# Patient Record
Sex: Female | Born: 1975 | Race: White | Hispanic: No | Marital: Married | Smoking: Former smoker
Health system: Southern US, Community
[De-identification: ages and names within clinical notes are randomized; demographics above are authoritative.]

## PROBLEM LIST (undated history)

## (undated) DIAGNOSIS — T4145XA Adverse effect of unspecified anesthetic, initial encounter: Secondary | ICD-10-CM

## (undated) DIAGNOSIS — T8859XA Other complications of anesthesia, initial encounter: Secondary | ICD-10-CM

## (undated) DIAGNOSIS — Z973 Presence of spectacles and contact lenses: Secondary | ICD-10-CM

## (undated) DIAGNOSIS — D649 Anemia, unspecified: Secondary | ICD-10-CM

---
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
## 1999-11-18 ENCOUNTER — Ambulatory Visit (HOSPITAL_BASED_OUTPATIENT_CLINIC_OR_DEPARTMENT_OTHER): Admission: RE | Admit: 1999-11-18 | Discharge: 1999-11-18 | Payer: Self-pay | Admitting: *Deleted

## 2011-11-07 LAB — OB RESULTS CONSOLE HEPATITIS B SURFACE ANTIGEN: Hepatitis B Surface Ag: NEGATIVE

## 2011-11-07 LAB — OB RESULTS CONSOLE RUBELLA ANTIBODY, IGM: Rubella: IMMUNE

## 2011-11-07 LAB — OB RESULTS CONSOLE HIV ANTIBODY (ROUTINE TESTING): HIV: NONREACTIVE

## 2011-11-07 LAB — OB RESULTS CONSOLE RPR: RPR: NONREACTIVE

## 2011-11-21 LAB — OB RESULTS CONSOLE GC/CHLAMYDIA: Gonorrhea: NEGATIVE

## 2012-01-25 NOTE — L&D Delivery Note (Signed)
Delivery Note  SVD viable female Apgars 8,9 over 2nd degree ML epis.  Placenta delivered spontaneously intact with 3VC. Repair with 2-0 Chromic with good support and hemostasis noted and R/V exam confirms.  PH art was sent.  [place] cord blood was not done.  Mother and baby were doing well.  EBL 350cc  Candice Camp, MD

## 2012-05-16 LAB — OB RESULTS CONSOLE GBS: GBS: NEGATIVE

## 2012-06-02 ENCOUNTER — Inpatient Hospital Stay (HOSPITAL_COMMUNITY)
Admission: AD | Admit: 2012-06-02 | Discharge: 2012-06-02 | Disposition: A | Discharge status: Home / Self Care | Payer: BC Managed Care – PPO | Source: Ambulatory Visit | Attending: Obstetrics & Gynecology | Admitting: Obstetrics & Gynecology

## 2012-06-02 ENCOUNTER — Encounter (HOSPITAL_COMMUNITY): Payer: Self-pay | Admitting: *Deleted

## 2012-06-02 DIAGNOSIS — N949 Unspecified condition associated with female genital organs and menstrual cycle: Secondary | ICD-10-CM | POA: Insufficient documentation

## 2012-06-02 DIAGNOSIS — N898 Other specified noninflammatory disorders of vagina: Secondary | ICD-10-CM

## 2012-06-02 DIAGNOSIS — O4700 False labor before 37 completed weeks of gestation, unspecified trimester: Secondary | ICD-10-CM | POA: Insufficient documentation

## 2012-06-02 DIAGNOSIS — O26893 Other specified pregnancy related conditions, third trimester: Secondary | ICD-10-CM

## 2012-06-02 DIAGNOSIS — O479 False labor, unspecified: Secondary | ICD-10-CM

## 2012-06-02 DIAGNOSIS — O99891 Other specified diseases and conditions complicating pregnancy: Secondary | ICD-10-CM | POA: Insufficient documentation

## 2012-06-02 LAB — POCT FERN TEST: POCT Fern Test: NEGATIVE

## 2012-06-02 NOTE — MAU Provider Note (Signed)
  History     CSN: [id number]  Arrival date and time: 06/02/12 1349   None     No chief complaint on file.  HPI This is a 37 y.o. female who presents at 59 weeks with c/o several episodes of underwear being wet. No wetting of linens. Does not feel contractions.   OB History   Grav Para Term Preterm Abortions TAB SAB Ect Mult Living   2 0        0      Past Medical History  Diagnosis Date  . Herpes     Past Surgical History  Procedure Laterality Date  . Appendectomy    . Cholecystectomy      No family history on file.  History  Substance Use Topics  . Smoking status: Former Smoker    Quit date: 10/04/2011  . Smokeless tobacco: Not on file  . Alcohol Use: No    Allergies: No Known Allergies  Prescriptions prior to admission  Medication Sig Dispense Refill  . Docosahexaenoic Acid (DHA PO) Take 1 tablet by mouth daily.      . Prenatal Vit-Fe Fumarate-FA (PRENATAL MULTIVITAMIN) TABS Take 1 tablet by mouth daily at 12 noon.      . valACYclovir (VALTREX) 500 MG tablet Take 500 mg by mouth daily.        Review of Systems  Constitutional: Negative for fever and chills.  Gastrointestinal: Negative for nausea, vomiting, abdominal pain, diarrhea and constipation.  Genitourinary: Negative for dysuria.   Physical Exam   Blood pressure 110/71, pulse 85, temperature 98.7 F (37.1 C), temperature source Oral, resp. rate 18.  Physical Exam  Constitutional: She is oriented to person, place, and time. She appears well-developed and well-nourished. No distress.  HENT:  Head: Normocephalic.  Cardiovascular: Normal rate.   Respiratory: Effort normal.  GI: Soft. She exhibits no distension and no mass. There is no tenderness. There is no rebound and no guarding.  Genitourinary: Vagina normal and uterus normal.  SSE:  No pooling            No ferning  Musculoskeletal: Normal range of motion.  Neurological: She is alert and oriented to person, place, and time.  Skin: Skin  is warm and dry.  Psychiatric: She has a normal mood and affect.   FHR reactive Mild contrations every 2-4 minutes, pt does not feel these  Dilation: 1 Effacement (%): 80 Cervical Position: Middle Station: -2 Presentation: Vertex Exam by:: M.Dimitrios Balestrieri,CNM  MAU Course  Procedures  Assessment and Plan  A:  SIUP at 38 wks      Vaginal discharge      No rupture of membranes  P:  Discharge home       Followup in office [facility] 06/02/2012, 3:32 PM

## 2012-06-04 ENCOUNTER — Inpatient Hospital Stay (HOSPITAL_COMMUNITY)
Admission: AD | Admit: 2012-06-04 | Discharge: 2012-06-04 | Disposition: A | Discharge status: Home / Self Care | Payer: BC Managed Care – PPO | Source: Ambulatory Visit | Attending: Obstetrics & Gynecology | Admitting: Obstetrics & Gynecology

## 2012-06-04 ENCOUNTER — Encounter (HOSPITAL_COMMUNITY): Payer: Self-pay | Admitting: Anesthesiology

## 2012-06-04 ENCOUNTER — Encounter (HOSPITAL_COMMUNITY): Payer: Self-pay | Admitting: *Deleted

## 2012-06-04 ENCOUNTER — Inpatient Hospital Stay (HOSPITAL_COMMUNITY): Payer: BC Managed Care – PPO | Admitting: Anesthesiology

## 2012-06-04 ENCOUNTER — Encounter (HOSPITAL_COMMUNITY): Payer: Self-pay

## 2012-06-04 ENCOUNTER — Inpatient Hospital Stay (HOSPITAL_COMMUNITY)
Admission: AD | Admit: 2012-06-04 | Discharge: 2012-06-06 | DRG: 373 | Disposition: A | Discharge status: Home / Self Care | Payer: BC Managed Care – PPO | Source: Ambulatory Visit | Attending: Obstetrics and Gynecology | Admitting: Obstetrics and Gynecology

## 2012-06-04 DIAGNOSIS — O878 Other venous complications in the puerperium: Secondary | ICD-10-CM | POA: Diagnosis present

## 2012-06-04 DIAGNOSIS — K649 Unspecified hemorrhoids: Secondary | ICD-10-CM | POA: Diagnosis present

## 2012-06-04 DIAGNOSIS — O4700 False labor before 37 completed weeks of gestation, unspecified trimester: Secondary | ICD-10-CM | POA: Insufficient documentation

## 2012-06-04 LAB — CBC
HCT: 34.9 % — ABNORMAL LOW (ref 36.0–46.0)
Hemoglobin: 11.8 g/dL — ABNORMAL LOW (ref 12.0–15.0)
MCH: 31.1 pg (ref 26.0–34.0)
MCHC: 33.8 g/dL (ref 30.0–36.0)
MCV: 92.1 fL (ref 78.0–100.0)
Platelets: 155 10*3/uL (ref 150–400)
RBC: 3.79 MIL/uL — ABNORMAL LOW (ref 3.87–5.11)
RDW: 14.4 % (ref 11.5–15.5)
WBC: 11.3 10*3/uL — ABNORMAL HIGH (ref 4.0–10.5)

## 2012-06-04 MED ORDER — PRENATAL MULTIVITAMIN CH: 1.0000 | ORAL_TABLET | Freq: Every day | ORAL | Status: DC

## 2012-06-04 MED ORDER — PHENYLEPHRINE 40 MCG/ML (10ML) SYRINGE FOR IV PUSH (FOR BLOOD PRESSURE SUPPORT)
80.0000 ug | PREFILLED_SYRINGE | INTRAVENOUS | Status: DC | PRN
  Filled 2012-06-04: qty 5
  Filled 2012-06-04: qty 2

## 2012-06-04 MED ORDER — DIPHENHYDRAMINE HCL 50 MG/ML IJ SOLN: 12.5000 mg | INTRAMUSCULAR | Status: DC | PRN

## 2012-06-04 MED ORDER — LIDOCAINE HCL (PF) 1 % IJ SOLN
30.0000 mL | INTRAMUSCULAR | Status: DC | PRN
  Filled 2012-06-04 (×2): qty 30

## 2012-06-04 MED ORDER — OXYTOCIN 40 UNITS IN LACTATED RINGERS INFUSION - SIMPLE MED
INTRAVENOUS | Status: AC
  Filled 2012-06-04: qty 1000

## 2012-06-04 MED ORDER — EPHEDRINE 5 MG/ML INJ
10.0000 mg | INTRAVENOUS | Status: DC | PRN
  Filled 2012-06-04: qty 2

## 2012-06-04 MED ORDER — PHENYLEPHRINE 40 MCG/ML (10ML) SYRINGE FOR IV PUSH (FOR BLOOD PRESSURE SUPPORT)
80.0000 ug | PREFILLED_SYRINGE | INTRAVENOUS | Status: DC | PRN
  Filled 2012-06-04: qty 2

## 2012-06-04 MED ORDER — LACTATED RINGERS IV SOLN
INTRAVENOUS | Status: DC
  Administered 2012-06-04 (×2): via INTRAVENOUS

## 2012-06-04 MED ORDER — HYDROCODONE-ACETAMINOPHEN 5-325 MG PO TABS
2.0000 | ORAL_TABLET | Freq: Once | ORAL | Status: AC | PRN
  Administered 2012-06-04: 2 via ORAL
  Filled 2012-06-04: qty 2

## 2012-06-04 MED ORDER — LIDOCAINE HCL (PF) 1 % IJ SOLN
INTRAMUSCULAR | Status: DC | PRN
  Administered 2012-06-04 (×2): 8 mL

## 2012-06-04 MED ORDER — OXYCODONE-ACETAMINOPHEN 5-325 MG PO TABS: 1.0000 | ORAL_TABLET | ORAL | Status: DC | PRN

## 2012-06-04 MED ORDER — ACETAMINOPHEN 325 MG PO TABS: 650.0000 mg | ORAL_TABLET | ORAL | Status: DC | PRN

## 2012-06-04 MED ORDER — IBUPROFEN 600 MG PO TABS
600.0000 mg | ORAL_TABLET | Freq: Four times a day (QID) | ORAL | Status: DC | PRN
  Administered 2012-06-04: 600 mg via ORAL
  Filled 2012-06-04: qty 1

## 2012-06-04 MED ORDER — FENTANYL 2.5 MCG/ML BUPIVACAINE 1/10 % EPIDURAL INFUSION (WH - ANES)
INTRAMUSCULAR | Status: DC | PRN
  Administered 2012-06-04: 14 mL/h via EPIDURAL

## 2012-06-04 MED ORDER — LACTATED RINGERS IV SOLN: 500.0000 mL | Freq: Once | INTRAVENOUS | Status: DC

## 2012-06-04 MED ORDER — OXYTOCIN BOLUS FROM INFUSION
500.0000 mL | INTRAVENOUS | Status: DC
  Administered 2012-06-04: 500 mL via INTRAVENOUS

## 2012-06-04 MED ORDER — OXYTOCIN 40 UNITS IN LACTATED RINGERS INFUSION - SIMPLE MED: 62.5000 mL/h | INTRAVENOUS | Status: DC

## 2012-06-04 MED ORDER — LACTATED RINGERS IV SOLN: 500.0000 mL | INTRAVENOUS | Status: DC | PRN

## 2012-06-04 MED ORDER — FLEET ENEMA 7-19 GM/118ML RE ENEM: 1.0000 | ENEMA | RECTAL | Status: DC | PRN

## 2012-06-04 MED ORDER — FENTANYL 2.5 MCG/ML BUPIVACAINE 1/10 % EPIDURAL INFUSION (WH - ANES)
14.0000 mL/h | INTRAMUSCULAR | Status: DC | PRN
  Filled 2012-06-04: qty 125

## 2012-06-04 MED ORDER — CITRIC ACID-SODIUM CITRATE 334-500 MG/5ML PO SOLN: 30.0000 mL | ORAL | Status: DC | PRN

## 2012-06-04 MED ORDER — EPHEDRINE 5 MG/ML INJ
10.0000 mg | INTRAVENOUS | Status: DC | PRN
  Filled 2012-06-04: qty 4
  Filled 2012-06-04: qty 2

## 2012-06-04 MED ORDER — ONDANSETRON HCL 4 MG/2ML IJ SOLN: 4.0000 mg | Freq: Four times a day (QID) | INTRAMUSCULAR | Status: DC | PRN

## 2012-06-04 NOTE — H&P (Signed)
Jocelyn Leonard is a 37 y.o. female presenting for labor eval.  Pregnancy uncomplicated.  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  Ctxs q 2-3 GBS-. History OB History   Grav Para Term Preterm Abortions TAB SAB Ect Mult Living   1 0        0     Past Medical History  Diagnosis Date  . Herpes    Past Surgical History  Procedure Laterality Date  . Appendectomy    . Cholecystectomy     Family History: family history is not on file. Social History:  reports that she quit smoking about 8 months ago. She has never used smokeless tobacco. She reports that she does not drink alcohol or use illicit drugs.   Prenatal Transfer Tool  Maternal Diabetes: No Genetic Screening: Normal Maternal Ultrasounds/Referrals: Normal Fetal Ultrasounds or other Referrals:  None Maternal Substance Abuse:  No Significant Maternal Medications:  None Significant Maternal Lab Results:  None Other Comments:  None  ROS  Dilation: 8.5 Effacement (%): 100 Station: -2 Exam by:: Dr. Rana Snare Blood pressure 118/76, pulse 117, temperature 98 F (36.7 Leonard), temperature source Oral, resp. rate 16, height 5\' 3"  (1.6 m), weight 88.451 kg (195 lb), SpO2 99.00%. Exam Physical Exam  Prenatal labs: ABO, Rh: AB/Positive/-- (10/14 0000) Antibody: Negative (10/14 0000) Rubella: Immune (10/14 0000) RPR: Nonreactive (10/14 0000)  HBsAg: Negative (10/14 0000)  HIV: Non-reactive (10/14 0000)  GBS: Negative (04/23 0000)   Assessment/Plan: IUP at term in active labor Anticipate SVD.  No sxs of HSV lesion.   Jocelyn Leonard 06/04/2012, 6:59 PM

## 2012-06-04 NOTE — MAU Note (Signed)
Pt seen in MAU this a.m., states uc's are stronger, denies bleeding or LOF.

## 2012-06-04 NOTE — Anesthesia Procedure Notes (Signed)
Epidural Patient location during procedure: OB Start time: 06/04/2012 6:30 PM End time: 06/04/2012 6:34 PM  Staffing Anesthesiologist: Sandrea Hughs  Preanesthetic Checklist Completed: patient identified, site marked, surgical consent, pre-op evaluation, timeout performed, IV checked, risks and benefits discussed and monitors and equipment checked  Epidural Patient position: sitting Prep: site prepped and draped and DuraPrep Patient monitoring: continuous pulse ox and blood pressure Approach: midline Injection technique: LOR air  Needle:  Needle type: Tuohy  Needle gauge: 17 G Needle length: 9 cm and 9 Needle insertion depth: 6 cm Catheter type: closed end flexible Catheter size: 19 Gauge Catheter at skin depth: 11 cm Test dose: negative and Other  Assessment Sensory level: T9 Events: blood not aspirated, injection not painful, no injection resistance, negative IV test and no paresthesia  Additional Notes Reason for block:procedure for pain

## 2012-06-04 NOTE — MAU Note (Signed)
Contractions every 3 minutes since 1am this morning. Denies leaking of fluid or vaginal bleeding.

## 2012-06-04 NOTE — Anesthesia Preprocedure Evaluation (Signed)

## 2012-06-05 LAB — CBC
Platelets: 140 10*3/uL — ABNORMAL LOW (ref 150–400)
RBC: 2.98 MIL/uL — ABNORMAL LOW (ref 3.87–5.11)
RDW: 14.7 % (ref 11.5–15.5)
WBC: 11.8 10*3/uL — ABNORMAL HIGH (ref 4.0–10.5)

## 2012-06-05 MED ORDER — DIBUCAINE 1 % RE OINT: 1.0000 "application " | TOPICAL_OINTMENT | RECTAL | Status: DC | PRN

## 2012-06-05 MED ORDER — PRENATAL MULTIVITAMIN CH
1.0000 | ORAL_TABLET | Freq: Every day | ORAL | Status: DC
  Administered 2012-06-05 – 2012-06-06 (×2): 1 via ORAL
  Filled 2012-06-05 (×2): qty 1

## 2012-06-05 MED ORDER — LANOLIN HYDROUS EX OINT: TOPICAL_OINTMENT | CUTANEOUS | Status: DC | PRN

## 2012-06-05 MED ORDER — TETANUS-DIPHTH-ACELL PERTUSSIS 5-2.5-18.5 LF-MCG/0.5 IM SUSP: 0.5000 mL | Freq: Once | INTRAMUSCULAR | Status: DC

## 2012-06-05 MED ORDER — SIMETHICONE 80 MG PO CHEW: 80.0000 mg | CHEWABLE_TABLET | ORAL | Status: DC | PRN

## 2012-06-05 MED ORDER — IBUPROFEN 600 MG PO TABS
600.0000 mg | ORAL_TABLET | Freq: Four times a day (QID) | ORAL | Status: DC
  Administered 2012-06-05 – 2012-06-06 (×5): 600 mg via ORAL
  Filled 2012-06-05 (×7): qty 1

## 2012-06-05 MED ORDER — ONDANSETRON HCL 4 MG/2ML IJ SOLN: 4.0000 mg | INTRAMUSCULAR | Status: DC | PRN

## 2012-06-05 MED ORDER — ONDANSETRON HCL 4 MG PO TABS: 4.0000 mg | ORAL_TABLET | ORAL | Status: DC | PRN

## 2012-06-05 MED ORDER — BENZOCAINE-MENTHOL 20-0.5 % EX AERO
1.0000 "application " | INHALATION_SPRAY | CUTANEOUS | Status: DC | PRN
  Administered 2012-06-06: 1 via TOPICAL
  Filled 2012-06-05 (×2): qty 56

## 2012-06-05 MED ORDER — MEASLES, MUMPS & RUBELLA VAC ~~LOC~~ INJ
0.5000 mL | INJECTION | Freq: Once | SUBCUTANEOUS | Status: DC
  Filled 2012-06-05: qty 0.5

## 2012-06-05 MED ORDER — SENNOSIDES-DOCUSATE SODIUM 8.6-50 MG PO TABS
2.0000 | ORAL_TABLET | Freq: Every day | ORAL | Status: DC
  Administered 2012-06-05 (×2): 2 via ORAL

## 2012-06-05 MED ORDER — DIPHENHYDRAMINE HCL 25 MG PO CAPS: 25.0000 mg | ORAL_CAPSULE | Freq: Four times a day (QID) | ORAL | Status: DC | PRN

## 2012-06-05 MED ORDER — HYDROCORTISONE ACE-PRAMOXINE 1-1 % RE FOAM
1.0000 | Freq: Two times a day (BID) | RECTAL | Status: DC
Start: 2012-06-05 — End: 2012-06-06
  Administered 2012-06-05 (×2): 1 via RECTAL
  Filled 2012-06-05: qty 10

## 2012-06-05 MED ORDER — ZOLPIDEM TARTRATE 5 MG PO TABS: 5.0000 mg | ORAL_TABLET | Freq: Every evening | ORAL | Status: DC | PRN

## 2012-06-05 MED ORDER — WITCH HAZEL-GLYCERIN EX PADS: 1.0000 "application " | MEDICATED_PAD | CUTANEOUS | Status: DC | PRN

## 2012-06-05 MED ORDER — MEDROXYPROGESTERONE ACETATE 150 MG/ML IM SUSP: 150.0000 mg | INTRAMUSCULAR | Status: DC | PRN

## 2012-06-05 MED ORDER — OXYCODONE-ACETAMINOPHEN 5-325 MG PO TABS
1.0000 | ORAL_TABLET | ORAL | Status: DC | PRN
  Administered 2012-06-05 – 2012-06-06 (×2): 1 via ORAL
  Filled 2012-06-05 (×3): qty 1

## 2012-06-05 NOTE — Anesthesia Postprocedure Evaluation (Signed)
Anesthesia Post Note  Patient: Jocelyn Leonard  Procedure(s) Performed: * No procedures listed *  Anesthesia type: Epidural  Patient location: Mother/Baby  Post pain: Pain level controlled  Post assessment: Post-op Vital signs reviewed  Last Vitals:  Filed Vitals:   06/05/12 0524  BP: 101/57  Pulse: 89  Temp: 36.8 C  Resp: 18    Post vital signs: Reviewed  Level of consciousness: awake  Complications: No apparent anesthesia complications

## 2012-06-05 NOTE — Progress Notes (Signed)
Post Partum Day 1 Subjective: no complaints, up ad lib, voiding and tolerating PO  Objective: Blood pressure 101/57, pulse 89, temperature 98.3 F (36.8 C), temperature source Oral, resp. rate 18, height 5\' 3"  (1.6 m), weight 195 lb (88.451 kg), SpO2 99.00%, unknown if currently breastfeeding.  Physical Exam:  General: alert and cooperative Lochia: appropriate Uterine Fundus: firm Incision: perineum intact, small hemorrhoids noted DVT Evaluation: No evidence of DVT seen on physical exam. Negative Homan's sign. No cords or calf tenderness. No significant calf/ankle edema.   Recent Labs  06/04/12 1800 06/05/12 0603  HGB 11.8* 9.2*  HCT 34.9* 27.4*    Assessment/Plan: Plan for discharge tomorrow Proctofoam HC   LOS: 1 day   Jocelyn Leonard G 06/05/2012, 8:16 AM

## 2012-06-06 MED ORDER — OXYCODONE-ACETAMINOPHEN 5-325 MG PO TABS: 1.0000 | ORAL_TABLET | ORAL | Status: DC | PRN

## 2012-06-06 MED ORDER — HYDROCORTISONE ACE-PRAMOXINE 1-1 % RE FOAM: 1.0000 | Freq: Two times a day (BID) | RECTAL | Status: DC

## 2012-06-06 MED ORDER — IBUPROFEN 600 MG PO TABS: 600.0000 mg | ORAL_TABLET | Freq: Four times a day (QID) | ORAL | Status: DC

## 2012-06-06 NOTE — Progress Notes (Signed)
Patient was referred for history of depression/anxiety. * Referral screened out by Clinical Social Worker because none of the following criteria appear to apply:  ~ History of anxiety/depression during this pregnancy, or of post-partum depression.  ~ Diagnosis of anxiety and/or depression within last 3 years, as per pt.  ~ History of depression due to pregnancy loss/loss of child  OR * Patient's symptoms currently being treated with medication and/or therapy.  Please contact the Clinical Social Worker if needs arise, or by the patient's request.  

## 2012-06-06 NOTE — Discharge Summary (Signed)
Obstetric Discharge Summary Reason for Admission: onset of labor Prenatal Procedures: ultrasound Intrapartum Procedures: spontaneous vaginal delivery Postpartum Procedures: none Complications-Operative and Postpartum: 2 degree perineal laceration Hemoglobin  Date Value Range Status  06/05/2012 9.2* 12.0 - 15.0 Leonard/dL Final     DELTA CHECK NOTED     REPEATED TO VERIFY     HCT  Date Value Range Status  06/05/2012 27.4* 36.0 - 46.0 % Final    Physical Exam:  General: alert and cooperative Lochia: appropriate Uterine Fundus: firm Incision: perineum intact , small hemorrhoids DVT Evaluation: No evidence of DVT seen on physical exam. Negative Homan's sign. No cords or calf tenderness. No significant calf/ankle edema.  Discharge Diagnoses: Term Pregnancy-delivered  Discharge Information: Date: 06/06/2012 Activity: pelvic rest Diet: routine Medications: PNV, Ibuprofen, Percocet and proctofoam HC Condition: stable Instructions: refer to practice specific booklet Discharge to: home   Newborn Data: Live born female  Birth Weight: 8 lb 7 oz (3827 Leonard) APGAR: 8, 9  Home with mother.  Jocelyn Leonard 06/06/2012, 8:31 AM

## 2012-11-14 ENCOUNTER — Encounter (HOSPITAL_COMMUNITY): Payer: Self-pay

## 2012-11-14 ENCOUNTER — Ambulatory Visit (HOSPITAL_COMMUNITY)
Admission: RE | Admit: 2012-11-14 | Discharge: 2012-11-14 | Disposition: A | Discharge status: Home / Self Care | Payer: BC Managed Care – PPO | Source: Ambulatory Visit | Attending: Family Medicine | Admitting: Family Medicine

## 2012-11-14 ENCOUNTER — Other Ambulatory Visit (HOSPITAL_COMMUNITY): Payer: Self-pay | Admitting: Family Medicine

## 2012-11-14 DIAGNOSIS — R079 Chest pain, unspecified: Secondary | ICD-10-CM | POA: Insufficient documentation

## 2012-11-14 DIAGNOSIS — R0602 Shortness of breath: Secondary | ICD-10-CM | POA: Insufficient documentation

## 2012-11-14 DIAGNOSIS — I2699 Other pulmonary embolism without acute cor pulmonale: Secondary | ICD-10-CM

## 2012-11-14 MED ORDER — IOHEXOL 350 MG/ML SOLN
100.0000 mL | Freq: Once | INTRAVENOUS | Status: AC | PRN
  Administered 2012-11-14: 100 mL via INTRAVENOUS

## 2013-11-25 ENCOUNTER — Encounter (HOSPITAL_COMMUNITY): Payer: Self-pay

## 2013-12-25 NOTE — H&P (Signed)
Jocelyn JamesKimberly Leonard  DICTATION # [id number] CSN# [id number]   Jocelyn PicaHOLLAND,Jocelyn Jesus M, MD 12/25/2013 1:57 PM

## 2013-12-26 ENCOUNTER — Encounter (HOSPITAL_BASED_OUTPATIENT_CLINIC_OR_DEPARTMENT_OTHER): Payer: Self-pay | Admitting: *Deleted

## 2013-12-26 NOTE — Progress Notes (Signed)
NPO AFTER MN WITH EXCEPTION CLEAR LIQUIDS UNTIL 0700 (NO CREAM/ MILK PRODUCTS).  ARRIVE AT 1130. NEEDS HG. 

## 2013-12-26 NOTE — H&P (Signed)
[name]:  [place]            ACCOUNT NO.:  [id number]  MEDICAL RECORD NO.:  [id number]  LOCATION:                                 FACILITY:  PHYSICIAN:  Duke Salviaichard M. Marcelle OverlieHolland, M.D.    DATE OF BIRTH:  DATE OF ADMISSION:  12/31/2013 DATE OF DISCHARGE:                             HISTORY & PHYSICAL   CHIEF COMPLAINT:  Missed AB.  HPI:  A 38 year old, G2, P1-0-0-1, presented to our office for new OB appointment.  Bedside ultrasound was concerning for no FHR.  Vaginal probe ultrasound done in the office on December 24, 2013, demonstrated a fetal pole noted, but there was no fetal movement, no FHR noted consistent with missed AB.  She presents now for D and E.  This procedure including specific risks related to bleeding, infection, other complications may require additional surgery.  All discussed with her which she understands and accepts.  PAST MEDICAL HISTORY:  Allergies:  None. Obstetrical History:  One vaginal delivery 2014, an 8 pound 7 ounce female.  SURGICAL HISTORY:  Unremarkable.  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  For the remainder of her social and family history, please see the [place] form for details.  PHYSICAL EXAMINATION:  VITAL SIGNS:  Temp 98.2, blood pressure 130/78. HEENT:  Unremarkable. NECK:  Supple without masses. LUNGS:  Clear. CARDIOVASCULAR:  Regular rate and rhythm without murmurs, rubs, or gallops.  BREASTS:  Without masses. ABDOMEN:  Soft, flat, nontender.  Vulva, vagina, cervix normal.  Uterus was 7 week size mid position.  Adnexa negative.  EXTREMITIES: Unremarkable. NEUROLOGIC:  Unremarkable.  IMPRESSION:  Missed abortion.  Blood type is AB positive.  PLAN:  D and E, procedure and risks reviewed as above.     Renato Spellman M. Marcelle OverlieHolland, M.D.    RMH/MEDQ  D:  12/25/2013  T:  12/25/2013  Job:  [id number]

## 2013-12-31 ENCOUNTER — Encounter (HOSPITAL_BASED_OUTPATIENT_CLINIC_OR_DEPARTMENT_OTHER)
Admission: RE | Disposition: A | Discharge status: Home / Self Care | Payer: Self-pay | Source: Ambulatory Visit | Attending: Obstetrics and Gynecology

## 2013-12-31 ENCOUNTER — Ambulatory Visit (HOSPITAL_BASED_OUTPATIENT_CLINIC_OR_DEPARTMENT_OTHER): Payer: 59 | Admitting: Anesthesiology

## 2013-12-31 ENCOUNTER — Encounter (HOSPITAL_BASED_OUTPATIENT_CLINIC_OR_DEPARTMENT_OTHER): Payer: Self-pay | Admitting: *Deleted

## 2013-12-31 ENCOUNTER — Ambulatory Visit (HOSPITAL_BASED_OUTPATIENT_CLINIC_OR_DEPARTMENT_OTHER)
Admission: RE | Admit: 2013-12-31 | Discharge: 2013-12-31 | Disposition: A | Discharge status: Home / Self Care | Payer: 59 | Source: Ambulatory Visit | Attending: Obstetrics and Gynecology | Admitting: Obstetrics and Gynecology

## 2013-12-31 DIAGNOSIS — Z9049 Acquired absence of other specified parts of digestive tract: Secondary | ICD-10-CM | POA: Insufficient documentation

## 2013-12-31 DIAGNOSIS — Z87891 Personal history of nicotine dependence: Secondary | ICD-10-CM | POA: Diagnosis not present

## 2013-12-31 DIAGNOSIS — O021 Missed abortion: Secondary | ICD-10-CM | POA: Insufficient documentation

## 2013-12-31 DIAGNOSIS — Z6830 Body mass index (BMI) 30.0-30.9, adult: Secondary | ICD-10-CM | POA: Diagnosis not present

## 2013-12-31 DIAGNOSIS — E669 Obesity, unspecified: Secondary | ICD-10-CM | POA: Diagnosis not present

## 2013-12-31 HISTORY — DX: Presence of spectacles and contact lenses: Z97.3

## 2013-12-31 HISTORY — PX: DILATION AND EVACUATION: SHX1459

## 2013-12-31 HISTORY — DX: Adverse effect of unspecified anesthetic, initial encounter: T41.45XA

## 2013-12-31 HISTORY — DX: Other complications of anesthesia, initial encounter: T88.59XA

## 2013-12-31 LAB — POCT HEMOGLOBIN-HEMACUE: HEMOGLOBIN: 12.8 g/dL (ref 12.0–15.0)

## 2013-12-31 SURGERY — DILATION AND EVACUATION, UTERUS
Anesthesia: Monitor Anesthesia Care | Site: Vagina

## 2013-12-31 MED ORDER — PROPOFOL INFUSION 10 MG/ML OPTIME
INTRAVENOUS | Status: DC | PRN
  Administered 2013-12-31: 160 ug/kg/min via INTRAVENOUS

## 2013-12-31 MED ORDER — PROPOFOL INFUSION 10 MG/ML OPTIME: INTRAVENOUS | Status: DC | PRN

## 2013-12-31 MED ORDER — MIDAZOLAM HCL 2 MG/2ML IJ SOLN
INTRAMUSCULAR | Status: AC
  Filled 2013-12-31: qty 2

## 2013-12-31 MED ORDER — MIDAZOLAM HCL 5 MG/5ML IJ SOLN
INTRAMUSCULAR | Status: DC | PRN
  Administered 2013-12-31: 1 mg via INTRAVENOUS

## 2013-12-31 MED ORDER — FENTANYL CITRATE 0.05 MG/ML IJ SOLN
INTRAMUSCULAR | Status: DC | PRN
  Administered 2013-12-31: 50 ug via INTRAVENOUS

## 2013-12-31 MED ORDER — ONDANSETRON HCL 4 MG/2ML IJ SOLN
INTRAMUSCULAR | Status: DC | PRN
  Administered 2013-12-31: 4 mg via INTRAVENOUS

## 2013-12-31 MED ORDER — KETOROLAC TROMETHAMINE 30 MG/ML IJ SOLN
INTRAMUSCULAR | Status: DC | PRN
Start: 2013-12-31 — End: 2013-12-31
  Administered 2013-12-31: 30 mg via INTRAVENOUS

## 2013-12-31 MED ORDER — FENTANYL CITRATE 0.05 MG/ML IJ SOLN
25.0000 ug | INTRAMUSCULAR | Status: DC | PRN
  Filled 2013-12-31: qty 1

## 2013-12-31 MED ORDER — SODIUM CHLORIDE 0.9 % IR SOLN
Status: DC | PRN
  Administered 2013-12-31: 500 mL

## 2013-12-31 MED ORDER — DEXAMETHASONE SODIUM PHOSPHATE 4 MG/ML IJ SOLN
INTRAMUSCULAR | Status: DC | PRN
  Administered 2013-12-31: 8 mg via INTRAVENOUS

## 2013-12-31 MED ORDER — LACTATED RINGERS IV SOLN
INTRAVENOUS | Status: DC
  Administered 2013-12-31: 12:00:00 via INTRAVENOUS
  Filled 2013-12-31: qty 1000

## 2013-12-31 MED ORDER — FENTANYL CITRATE 0.05 MG/ML IJ SOLN
INTRAMUSCULAR | Status: AC
  Filled 2013-12-31: qty 4

## 2013-12-31 MED ORDER — PROMETHAZINE HCL 25 MG/ML IJ SOLN
6.2500 mg | INTRAMUSCULAR | Status: DC | PRN
  Filled 2013-12-31: qty 1

## 2013-12-31 MED ORDER — HYDROCODONE-IBUPROFEN 7.5-200 MG PO TABS: 1.0000 | ORAL_TABLET | Freq: Three times a day (TID) | ORAL | Status: DC | PRN

## 2013-12-31 SURGICAL SUPPLY — 30 items
CATH ROBINSON RED A/P 16FR (CATHETERS) ×2 IMPLANT
COVER TABLE BACK 60X90 (DRAPES) ×2 IMPLANT
DRAPE LG THREE QUARTER DISP (DRAPES) ×2 IMPLANT
DRAPE UNDERBUTTOCKS STRL (DRAPE) ×2 IMPLANT
DRESSING TELFA 8X3 (GAUZE/BANDAGES/DRESSINGS) ×2 IMPLANT
GLOVE BIO SURGEON STRL SZ 6.5 (GLOVE) ×1 IMPLANT
GLOVE BIO SURGEON STRL SZ7 (GLOVE) ×4 IMPLANT
GLOVE BIOGEL PI IND STRL 6.5 (GLOVE) IMPLANT
GLOVE BIOGEL PI IND STRL 7.5 (GLOVE) IMPLANT
GLOVE BIOGEL PI INDICATOR 6.5 (GLOVE) ×1
GLOVE BIOGEL PI INDICATOR 7.5 (GLOVE)
GLOVE SURG SS PI 7.5 STRL IVOR (GLOVE) ×2 IMPLANT
GOWN STRL REUS W/ TWL LRG LVL3 (GOWN DISPOSABLE) ×1 IMPLANT
GOWN STRL REUS W/TWL LRG LVL3 (GOWN DISPOSABLE) ×4
LEGGING LITHOTOMY PAIR STRL (DRAPES) ×2 IMPLANT
NDL SPNL 22GX3.5 QUINCKE BK (NEEDLE) IMPLANT
NEEDLE SPNL 22GX3.5 QUINCKE BK (NEEDLE) ×2 IMPLANT
NS IRRIG 500ML POUR BTL (IV SOLUTION) ×1 IMPLANT
PACK BASIN DAY SURGERY FS (CUSTOM PROCEDURE TRAY) ×2 IMPLANT
PAD OB MATERNITY 4.3X12.25 (PERSONAL CARE ITEMS) ×2 IMPLANT
PAD PREP 24X48 CUFFED NSTRL (MISCELLANEOUS) ×2 IMPLANT
SURGILUBE 2OZ TUBE FLIPTOP (MISCELLANEOUS) ×2 IMPLANT
SUT VIC AB 2-0 UR6 27 (SUTURE) ×1 IMPLANT
SYR CONTROL 10ML LL (SYRINGE) ×1 IMPLANT
TOWEL OR 17X24 6PK STRL BLUE (TOWEL DISPOSABLE) ×3 IMPLANT
TRAY DSU PREP LF (CUSTOM PROCEDURE TRAY) ×2 IMPLANT
VACURETTE 7MM CVD STRL WRAP (CANNULA) ×1 IMPLANT
VACURETTE 8 RIGID CVD (CANNULA) IMPLANT
VACURETTE 9 RIGID CVD (CANNULA) IMPLANT
WATER STERILE IRR 500ML POUR (IV SOLUTION) ×2 IMPLANT

## 2013-12-31 NOTE — Discharge Instructions (Signed)
° °  D & C Home care Instructions: ° ° °Personal hygiene:  Used sanitary napkins for vaginal drainage not tampons. Shower or tub bathe the day after your procedure. No douching until bleeding stops. Always wipe from front to back after  Elimination. ° °Activity: Do not drive or operate any equipment today. The effects of the anesthesia are still present and drowsiness may result. Rest today, not necessarily flat bed rest, just take it easy. You may resume your normal activity in one to 2 days. ° °Sexual activity: No intercourse for one week or as indicated by your physician ° °Diet: Eat a light diet as desired this evening. You may resume a regular diet tomorrow. ° °Return to work: One to 2 days. ° °General Expectations of your surgery: Vaginal bleeding should be no heavier than a normal period. Spotting may continue up to 10 days. Mild cramps may continue for a couple of days. You may have a regular period in 2-6 weeks. ° °Unexpected observations call your doctor if these occur: persistent or heavy bleeding. Severe abdominal cramping or pain. Elevation of temperature greater than 100°F. ° °Call for an appointment in one week. ° ° ° ° °Post Anesthesia Home Care Instructions ° °Activity: °Get plenty of rest for the remainder of the day. A responsible adult should stay with you for 24 hours following the procedure.  °For the next 24 hours, DO NOT: °-Drive a car °-Operate machinery °-Drink alcoholic beverages °-Take any medication unless instructed by your physician °-Make any legal decisions or sign important papers. ° °Meals: °Start with liquid foods such as gelatin or soup. Progress to regular foods as tolerated. Avoid greasy, spicy, heavy foods. If nausea and/or vomiting occur, drink only clear liquids until the nausea and/or vomiting subsides. Call your physician if vomiting continues. ° °Special Instructions/Symptoms: °Your throat may feel dry or sore from the anesthesia or the breathing tube placed in your throat  during surgery. If this causes discomfort, gargle with warm salt water. The discomfort should disappear within 24 hours. ° °

## 2013-12-31 NOTE — Op Note (Signed)
Jocelyn JamesKimberly Leonard  DICTATION # [id number] CSN# [id number]   Jocelyn PicaHOLLAND,Jocelyn Leonard M, MD 12/31/2013 1:19 PM

## 2013-12-31 NOTE — Transfer of Care (Signed)
Immediate Anesthesia Transfer of Care Note  Patient: Jocelyn Leonard  Procedure(s) Performed: Procedure(s) (LRB): DILATATION AND EVACUATION (N/A)  Patient Location: PACU  Anesthesia Type: MAC  Level of Consciousness: awake, alert , oriented and patient cooperative  Airway & Oxygen Therapy: Patient Spontanous Breathing and Patient connected to face mask oxygen  Post-op Assessment: Report given to PACU RN and Post -op Vital signs reviewed and stable  Post vital signs: Reviewed and stable  Complications: No apparent anesthesia complications

## 2013-12-31 NOTE — Progress Notes (Signed)
The patient was re-examined with no change in status 

## 2013-12-31 NOTE — Anesthesia Postprocedure Evaluation (Signed)
  Anesthesia Post-op Note  Patient: Jocelyn Leonard  Procedure(s) Performed: Procedure(s) (LRB): DILATATION AND EVACUATION (N/A)  Patient Location: PACU  Anesthesia Type: MAC  Level of Consciousness: awake and alert   Airway and Oxygen Therapy: Patient Spontanous Breathing  Post-op Pain: mild  Post-op Assessment: Post-op Vital signs reviewed, Patient's Cardiovascular Status Stable, Respiratory Function Stable, Patent Airway and No signs of Nausea or vomiting  Last Vitals:  Filed Vitals:   12/31/13 1420  BP: 101/71  Pulse: 72  Temp: 36.9 C  Resp: 18    Post-op Vital Signs: stable   Complications: No apparent anesthesia complications

## 2013-12-31 NOTE — Anesthesia Preprocedure Evaluation (Signed)
Anesthesia Evaluation  Patient identified by MRN, date of birth, ID band Patient awake    Reviewed: Allergy & Precautions, H&P , NPO status , Patient's Chart, lab work & pertinent test results  History of Anesthesia Complications (+) history of anesthetic complications  Airway Mallampati: II  TM Distance: >3 FB Neck ROM: Full    Dental no notable dental hx.    Pulmonary former smoker,  breath sounds clear to auscultation  Pulmonary exam normal       Cardiovascular negative cardio ROS  Rhythm:Regular Rate:Normal     Neuro/Psych negative neurological ROS  negative psych ROS   GI/Hepatic negative GI ROS, Neg liver ROS,   Endo/Other  negative endocrine ROS  Renal/GU negative Renal ROS  negative genitourinary   Musculoskeletal negative musculoskeletal ROS (+)   Abdominal (+) + obese,   Peds negative pediatric ROS (+)  Hematology negative hematology ROS (+)   Anesthesia Other Findings   Reproductive/Obstetrics negative OB ROS                             Anesthesia Physical Anesthesia Plan  ASA: II  Anesthesia Plan: MAC   Post-op Pain Management:    Induction: Intravenous  Airway Management Planned:   Additional Equipment:   Intra-op Plan:   Post-operative Plan:   Informed Consent: I have reviewed the patients History and Physical, chart, labs and discussed the procedure including the risks, benefits and alternatives for the proposed anesthesia with the patient or authorized representative who has indicated his/her understanding and acceptance.   Dental advisory given  Plan Discussed with: CRNA  Anesthesia Plan Comments: (Discussed MAC and general. She prefers MAC)        Anesthesia Quick Evaluation

## 2014-01-01 ENCOUNTER — Encounter (HOSPITAL_BASED_OUTPATIENT_CLINIC_OR_DEPARTMENT_OTHER): Payer: Self-pay | Admitting: Obstetrics and Gynecology

## 2014-01-03 NOTE — Op Note (Signed)
[name]:  Jocelyn Leonard, Jocelyn Leonard            ACCOUNT NO.:  [id number]  MEDICAL RECORD NO.:  [id number]  LOCATION:                                 FACILITY:  PHYSICIAN:  Duke Salviaichard M. Marcelle OverlieHolland, M.D.DATE OF BIRTH:  December 24, 1975  DATE OF PROCEDURE:  12/30/2013 DATE OF DISCHARGE:                              OPERATIVE REPORT   PREOPERATIVE DIAGNOSIS:  Missed abortion.  POSTOPERATIVE DIAGNOSIS:  Missed abortion.  PROCEDURE:  Dilation and evacuation.  SURGEON:  Duke Salviaichard M. Marcelle OverlieHolland, MD  ANESTHESIA:  Conscious sedation plus paracervical block.  EBL:  Less than 50 mL.  PROCEDURE AND FINDINGS:  The patient was taken to the operating room. After an adequate level of sedation was obtained with the patient's legs in stirrups, the perineum and vagina were prepped and draped in the usual fashion for D and E.  The bladder was drained, EUA carried out. Uterus, 8-week size, mid position, adnexa negative.  Appropriate time- out was taken at that point.  Speculum was positioned, cervix grasped with a tenaculum.  Paracervical block was then created by infiltrating at 3 and 9 o'clock submucosally with 5-7 mL of 1% plain Xylocaine at each site after negative aspiration.  The cervix was already dilated with some minimal bleeding, was sounded to 9 cm, a #7 curved curette was then used to curette a moderate amount of tissue by suction curettage. When no further tissue could be removed, a medium blunt curette was used to explore the cavity revealing it to be clean.  There was minimal bleeding.  She tolerated this well and went to recovery room in good condition.     [name], M.D.     RMH/MEDQ  D:  12/31/2013  T:  12/31/2013  Job:  [id number]

## 2014-09-10 ENCOUNTER — Other Ambulatory Visit: Payer: Self-pay | Admitting: Obstetrics and Gynecology

## 2014-09-11 LAB — CYTOLOGY - PAP

## 2014-11-20 IMAGING — CT CT ANGIO CHEST
2 of 6 series · 19 of 36 positions shown · IV contrast (OMNIPAQUE)
Comparison: None.

CLINICAL DATA: Short of breath and chest pain

EXAM:
CT ANGIOGRAPHY CHEST WITH CONTRAST
TECHNIQUE: Multidetector CT imaging of the chest was performed using the
standard protocol during bolus administration of intravenous
contrast. Multiplanar CT image reconstructions including MIPs were
obtained to evaluate the vascular anatomy.
CONTRAST:  100mL OMNIPAQUE IOHEXOL 350 MG/ML SOLN

[Series 6: pe thins @ 1mm · axial · 0.74mm/px · z∈[+948,+1173]mm · 18 of 251 slices shown]
[im 13/251  lung]
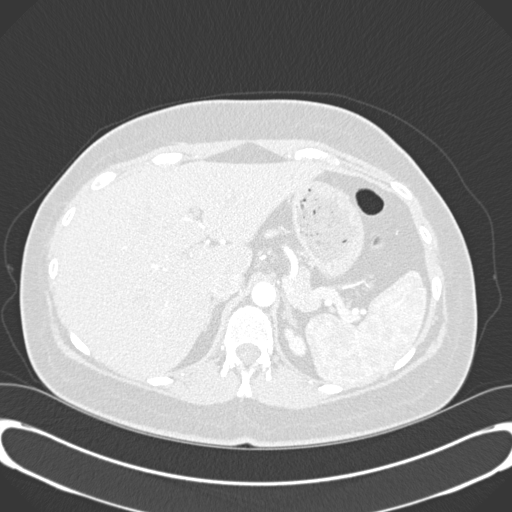
[im 26/251  mediastinal]
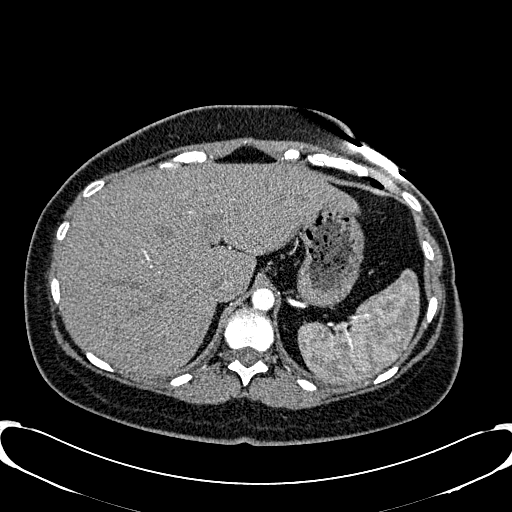
[im 38/251  lung]
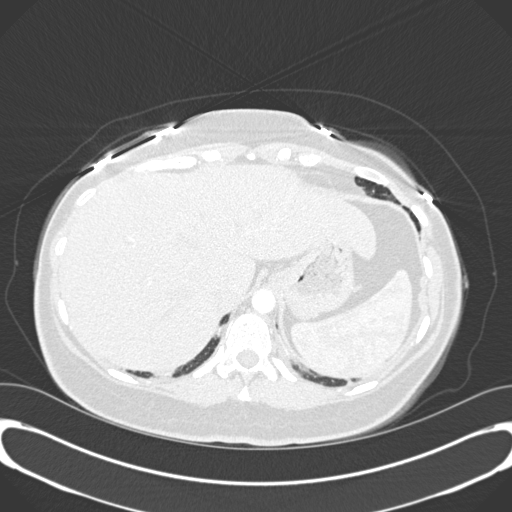
[im 51/251  mediastinal]
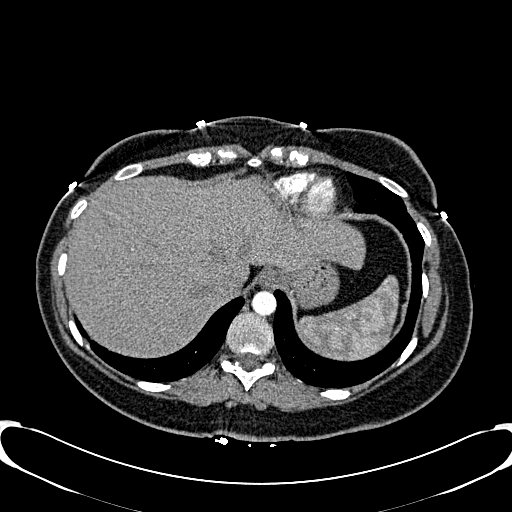
[im 63/251  lung]
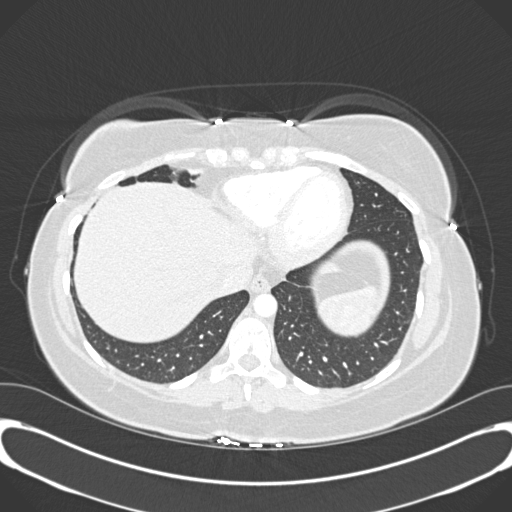
[im 76/251  mediastinal]
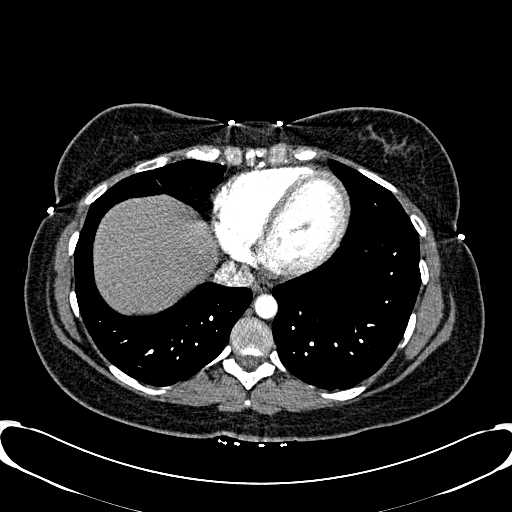
[im 88/251  lung]
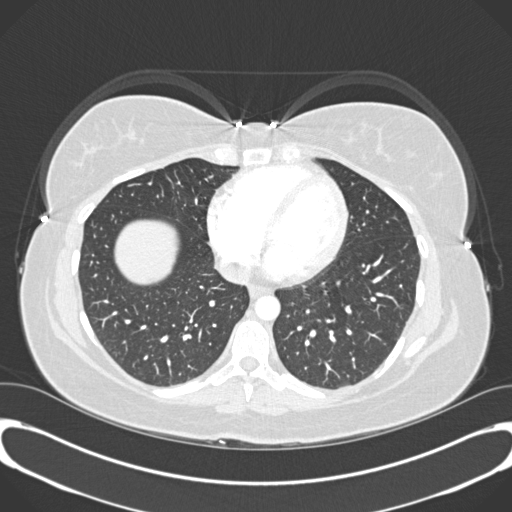
[im 101/251  mediastinal]
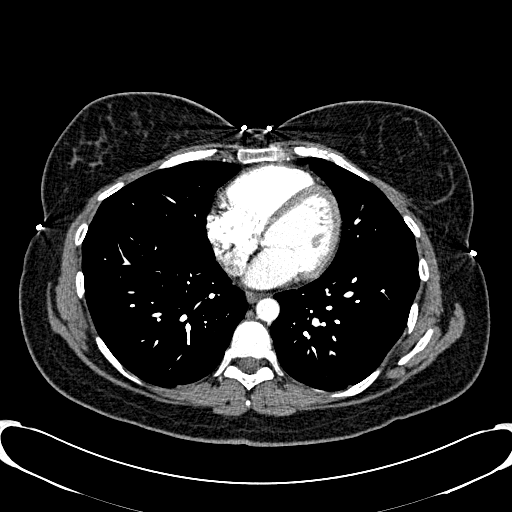
[im 113/251  lung]
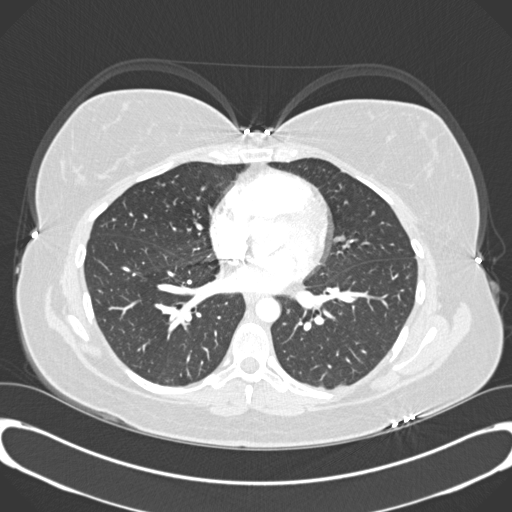
[im 138/251  mediastinal]
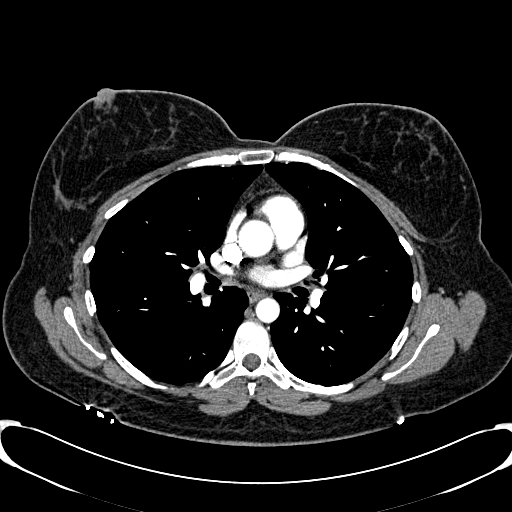
[im 151/251  lung]
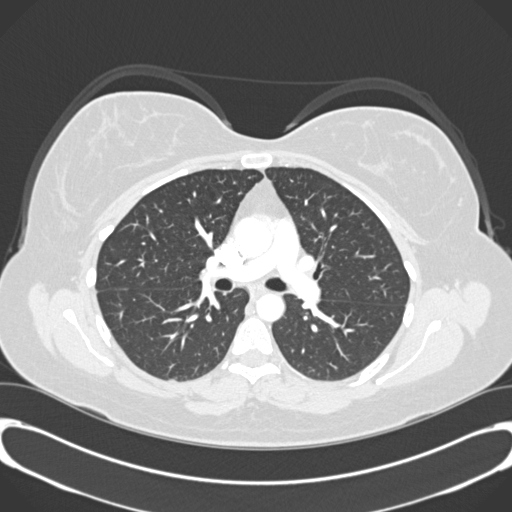
[im 163/251  mediastinal]
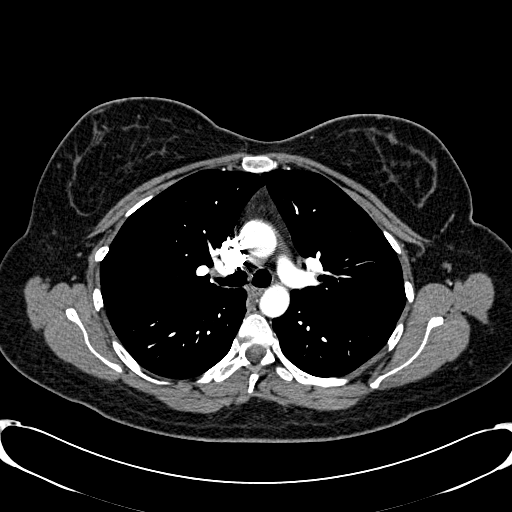
[im 176/251  lung]
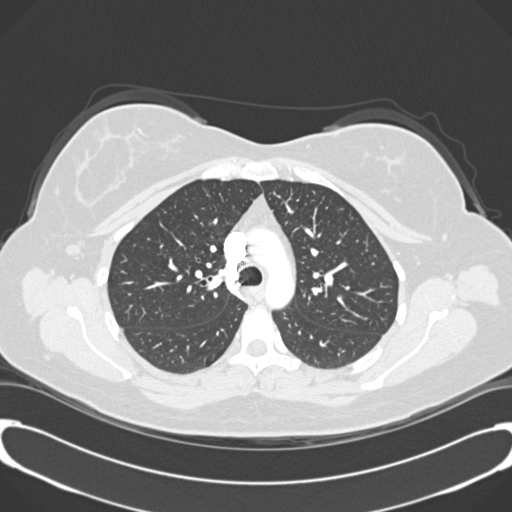
[im 188/251  mediastinal]
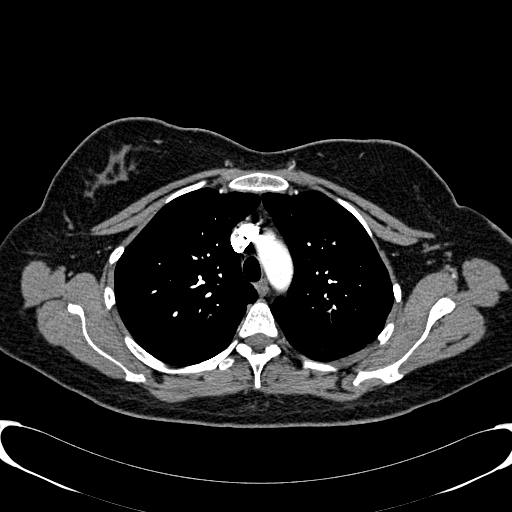
[im 201/251  lung]
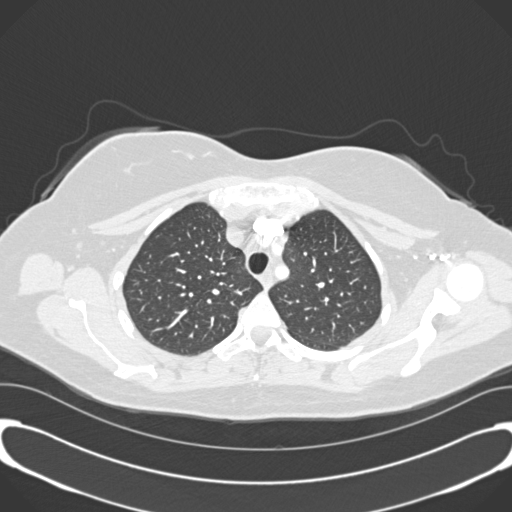
[im 213/251  mediastinal]
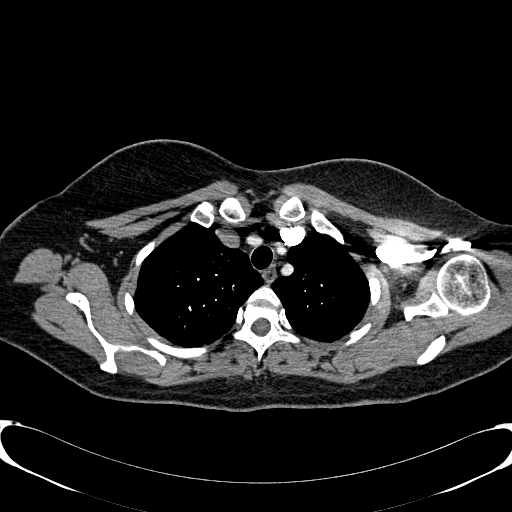
[im 226/251  lung]
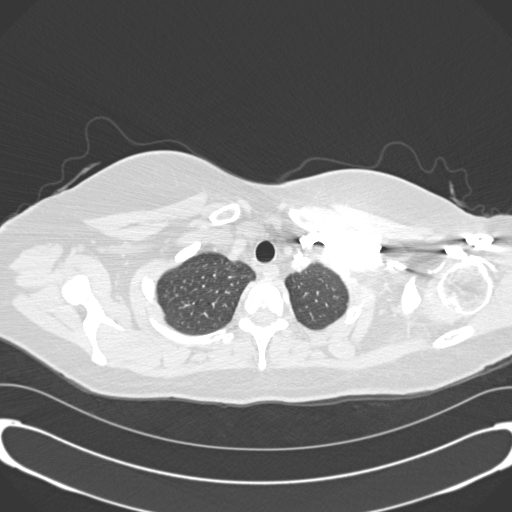
[im 238/251  mediastinal]
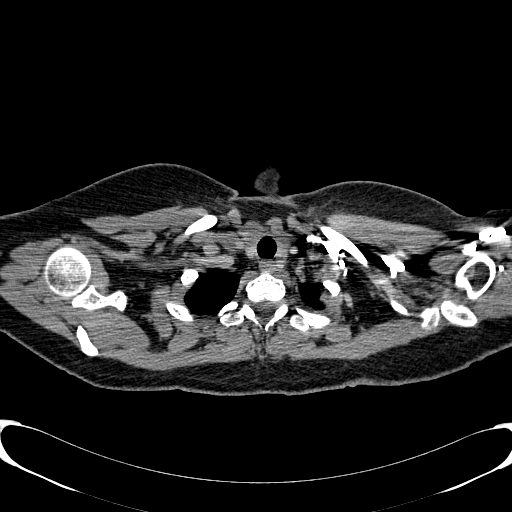

[Series 602: cor · coronal · 0.74mm/px · 1 of 110 slices shown]
[im 55/110  mediastinal]
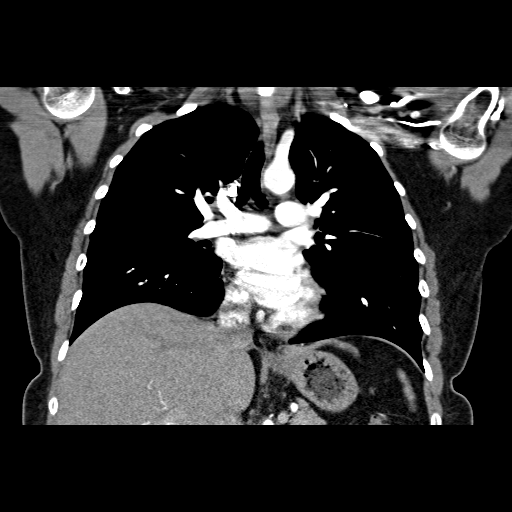

[19 of 36 positions shown; findings below may reference images not displayed]

FINDINGS: There are no filling defects in the pulmonary arterial tree to
suggest acute pulmonary thromboembolism.

No abnormal mediastinal adenopathy.

No pericardial effusion. No pleural effusion and no pneumothorax.

No acute bony deformity.

Review of the MIP images confirms the above findings.
IMPRESSION: No evidence of acute pulmonary thromboembolism.

## 2015-11-25 LAB — OB RESULTS CONSOLE RUBELLA ANTIBODY, IGM: Rubella: IMMUNE

## 2015-11-25 LAB — OB RESULTS CONSOLE ABO/RH: RH TYPE: POSITIVE

## 2015-11-25 LAB — OB RESULTS CONSOLE RPR: RPR: NONREACTIVE

## 2015-11-25 LAB — OB RESULTS CONSOLE HIV ANTIBODY (ROUTINE TESTING): HIV: NONREACTIVE

## 2015-11-25 LAB — OB RESULTS CONSOLE ANTIBODY SCREEN: Antibody Screen: NEGATIVE

## 2015-12-02 LAB — OB RESULTS CONSOLE GC/CHLAMYDIA
CHLAMYDIA, DNA PROBE: NEGATIVE
Gonorrhea: NEGATIVE

## 2016-01-25 NOTE — L&D Delivery Note (Signed)
Delivery Note At 10:52 PM a viable female was delivered via Vaginal, Spontaneous Delivery (Presentation:OA ;  ).  APGAR: 8, 9; weight  .   Placenta status:spont>>intact , .  Cord:  with the following complications: .  Cord pH: not sent Loose nuchal x 1   Anesthesia:  epid Episiotomy: None Lacerations: 2nd degree;Perineal Suture Repair: 3.0 vicryl rapide Est. Blood Loss (mL):  300  Mom to postpartum.  Baby to Couplet care / Skin to Skin.  Meriel PicaHOLLAND,Demetreus Lothamer M 06/21/2016, 11:05 PM

## 2016-01-28 DIAGNOSIS — O09522 Supervision of elderly multigravida, second trimester: Secondary | ICD-10-CM | POA: Diagnosis not present

## 2016-01-28 DIAGNOSIS — Z3480 Encounter for supervision of other normal pregnancy, unspecified trimester: Secondary | ICD-10-CM | POA: Diagnosis not present

## 2016-01-28 DIAGNOSIS — Z363 Encounter for antenatal screening for malformations: Secondary | ICD-10-CM | POA: Diagnosis not present

## 2016-01-28 DIAGNOSIS — Z36 Encounter for antenatal screening for chromosomal anomalies: Secondary | ICD-10-CM | POA: Diagnosis not present

## 2016-01-28 DIAGNOSIS — Z3A18 18 weeks gestation of pregnancy: Secondary | ICD-10-CM | POA: Diagnosis not present

## 2016-02-25 DIAGNOSIS — Z362 Encounter for other antenatal screening follow-up: Secondary | ICD-10-CM | POA: Diagnosis not present

## 2016-03-28 DIAGNOSIS — Z3480 Encounter for supervision of other normal pregnancy, unspecified trimester: Secondary | ICD-10-CM | POA: Diagnosis not present

## 2016-03-28 DIAGNOSIS — Z23 Encounter for immunization: Secondary | ICD-10-CM | POA: Diagnosis not present

## 2016-05-18 DIAGNOSIS — Z3483 Encounter for supervision of other normal pregnancy, third trimester: Secondary | ICD-10-CM | POA: Diagnosis not present

## 2016-05-18 DIAGNOSIS — Z3482 Encounter for supervision of other normal pregnancy, second trimester: Secondary | ICD-10-CM | POA: Diagnosis not present

## 2016-05-25 DIAGNOSIS — Z3480 Encounter for supervision of other normal pregnancy, unspecified trimester: Secondary | ICD-10-CM | POA: Diagnosis not present

## 2016-05-25 DIAGNOSIS — Z36 Encounter for antenatal screening for chromosomal anomalies: Secondary | ICD-10-CM | POA: Diagnosis not present

## 2016-05-25 LAB — OB RESULTS CONSOLE GBS: GBS: NEGATIVE

## 2016-06-15 DIAGNOSIS — Z3A38 38 weeks gestation of pregnancy: Secondary | ICD-10-CM | POA: Diagnosis not present

## 2016-06-15 DIAGNOSIS — O9989 Other specified diseases and conditions complicating pregnancy, childbirth and the puerperium: Secondary | ICD-10-CM | POA: Diagnosis not present

## 2016-06-21 ENCOUNTER — Inpatient Hospital Stay (HOSPITAL_COMMUNITY): Payer: BLUE CROSS/BLUE SHIELD | Admitting: Anesthesiology

## 2016-06-21 ENCOUNTER — Inpatient Hospital Stay (HOSPITAL_COMMUNITY)
Admission: AD | Admit: 2016-06-21 | Discharge: 2016-06-23 | DRG: 774 | Disposition: A | Discharge status: Home / Self Care | Payer: BLUE CROSS/BLUE SHIELD | Source: Ambulatory Visit | Attending: Obstetrics and Gynecology | Admitting: Obstetrics and Gynecology

## 2016-06-21 ENCOUNTER — Encounter (HOSPITAL_COMMUNITY): Payer: Self-pay | Admitting: *Deleted

## 2016-06-21 DIAGNOSIS — O9832 Other infections with a predominantly sexual mode of transmission complicating childbirth: Secondary | ICD-10-CM | POA: Diagnosis not present

## 2016-06-21 DIAGNOSIS — Z23 Encounter for immunization: Secondary | ICD-10-CM | POA: Diagnosis not present

## 2016-06-21 DIAGNOSIS — A6000 Herpesviral infection of urogenital system, unspecified: Secondary | ICD-10-CM | POA: Diagnosis present

## 2016-06-21 DIAGNOSIS — Z87891 Personal history of nicotine dependence: Secondary | ICD-10-CM | POA: Diagnosis not present

## 2016-06-21 DIAGNOSIS — Z3A39 39 weeks gestation of pregnancy: Secondary | ICD-10-CM

## 2016-06-21 DIAGNOSIS — Z3493 Encounter for supervision of normal pregnancy, unspecified, third trimester: Secondary | ICD-10-CM | POA: Diagnosis not present

## 2016-06-21 DIAGNOSIS — Z3490 Encounter for supervision of normal pregnancy, unspecified, unspecified trimester: Secondary | ICD-10-CM

## 2016-06-21 HISTORY — DX: Anemia, unspecified: D64.9

## 2016-06-21 LAB — CBC
HEMATOCRIT: 34.1 % — AB (ref 36.0–46.0)
Hemoglobin: 11.6 g/dL — ABNORMAL LOW (ref 12.0–15.0)
MCH: 31.8 pg (ref 26.0–34.0)
MCHC: 34 g/dL (ref 30.0–36.0)
MCV: 93.4 fL (ref 78.0–100.0)
PLATELETS: 124 10*3/uL — AB (ref 150–400)
RBC: 3.65 MIL/uL — ABNORMAL LOW (ref 3.87–5.11)
RDW: 14.9 % (ref 11.5–15.5)
WBC: 10.2 10*3/uL (ref 4.0–10.5)

## 2016-06-21 LAB — TYPE AND SCREEN
ABO/RH(D): AB POS
Antibody Screen: NEGATIVE

## 2016-06-21 MED ORDER — FENTANYL 2.5 MCG/ML BUPIVACAINE 1/10 % EPIDURAL INFUSION (WH - ANES)
INTRAMUSCULAR | Status: AC
  Filled 2016-06-21: qty 100

## 2016-06-21 MED ORDER — LACTATED RINGERS IV SOLN
500.0000 mL | Freq: Once | INTRAVENOUS | Status: AC
  Administered 2016-06-21: 500 mL via INTRAVENOUS

## 2016-06-21 MED ORDER — LACTATED RINGERS IV SOLN: 500.0000 mL | INTRAVENOUS | Status: DC | PRN

## 2016-06-21 MED ORDER — SOD CITRATE-CITRIC ACID 500-334 MG/5ML PO SOLN: 30.0000 mL | ORAL | Status: DC | PRN

## 2016-06-21 MED ORDER — EPHEDRINE 5 MG/ML INJ
10.0000 mg | INTRAVENOUS | Status: DC | PRN
  Filled 2016-06-21: qty 2

## 2016-06-21 MED ORDER — PHENYLEPHRINE 40 MCG/ML (10ML) SYRINGE FOR IV PUSH (FOR BLOOD PRESSURE SUPPORT)
80.0000 ug | PREFILLED_SYRINGE | INTRAVENOUS | Status: DC | PRN
  Filled 2016-06-21: qty 5

## 2016-06-21 MED ORDER — IBUPROFEN 800 MG PO TABS
800.0000 mg | ORAL_TABLET | Freq: Three times a day (TID) | ORAL | Status: DC | PRN
  Administered 2016-06-22 – 2016-06-23 (×4): 800 mg via ORAL
  Filled 2016-06-21 (×4): qty 1

## 2016-06-21 MED ORDER — OXYCODONE-ACETAMINOPHEN 5-325 MG PO TABS
2.0000 | ORAL_TABLET | ORAL | Status: DC | PRN
Start: 2016-06-21 — End: 2016-06-22

## 2016-06-21 MED ORDER — PHENYLEPHRINE 40 MCG/ML (10ML) SYRINGE FOR IV PUSH (FOR BLOOD PRESSURE SUPPORT)
PREFILLED_SYRINGE | INTRAVENOUS | Status: AC
  Filled 2016-06-21: qty 20

## 2016-06-21 MED ORDER — ONDANSETRON HCL 4 MG/2ML IJ SOLN: 4.0000 mg | Freq: Four times a day (QID) | INTRAMUSCULAR | Status: DC | PRN

## 2016-06-21 MED ORDER — OXYTOCIN 40 UNITS IN LACTATED RINGERS INFUSION - SIMPLE MED
2.5000 [IU]/h | INTRAVENOUS | Status: DC
  Filled 2016-06-21: qty 1000

## 2016-06-21 MED ORDER — FENTANYL 2.5 MCG/ML BUPIVACAINE 1/10 % EPIDURAL INFUSION (WH - ANES)
14.0000 mL/h | INTRAMUSCULAR | Status: DC | PRN
  Administered 2016-06-21: 14 mL/h via EPIDURAL

## 2016-06-21 MED ORDER — LACTATED RINGERS IV SOLN
INTRAVENOUS | Status: DC
  Administered 2016-06-21 (×2): via INTRAVENOUS

## 2016-06-21 MED ORDER — LIDOCAINE HCL (PF) 1 % IJ SOLN
INTRAMUSCULAR | Status: DC | PRN
  Administered 2016-06-21 (×2): 5 mL via EPIDURAL

## 2016-06-21 MED ORDER — LIDOCAINE HCL (PF) 1 % IJ SOLN
30.0000 mL | INTRAMUSCULAR | Status: DC | PRN
  Filled 2016-06-21: qty 30

## 2016-06-21 MED ORDER — OXYCODONE-ACETAMINOPHEN 5-325 MG PO TABS: 1.0000 | ORAL_TABLET | ORAL | Status: DC | PRN

## 2016-06-21 MED ORDER — OXYTOCIN BOLUS FROM INFUSION
500.0000 mL | Freq: Once | INTRAVENOUS | Status: AC
  Administered 2016-06-21: 500 mL via INTRAVENOUS

## 2016-06-21 MED ORDER — DIPHENHYDRAMINE HCL 50 MG/ML IJ SOLN: 12.5000 mg | INTRAMUSCULAR | Status: DC | PRN

## 2016-06-21 MED ORDER — FLEET ENEMA 7-19 GM/118ML RE ENEM: 1.0000 | ENEMA | RECTAL | Status: DC | PRN

## 2016-06-21 MED ORDER — ACETAMINOPHEN 325 MG PO TABS: 650.0000 mg | ORAL_TABLET | ORAL | Status: DC | PRN

## 2016-06-21 NOTE — MAU Note (Signed)
Urine sent to lab 

## 2016-06-21 NOTE — Anesthesia Preprocedure Evaluation (Signed)
Anesthesia Evaluation  Patient identified by MRN, date of birth, ID band Patient awake    Reviewed: Allergy & Precautions, H&P , NPO status , Patient's Chart, lab work & pertinent test results  History of Anesthesia Complications Negative for: history of anesthetic complications  Airway Mallampati: II  TM Distance: >3 FB Neck ROM: Full    Dental no notable dental hx. (+) Dental Advisory Given   Pulmonary former smoker,    Pulmonary exam normal breath sounds clear to auscultation       Cardiovascular negative cardio ROS Normal cardiovascular exam Rhythm:Regular Rate:Normal     Neuro/Psych negative neurological ROS  negative psych ROS   GI/Hepatic negative GI ROS, Neg liver ROS,   Endo/Other  negative endocrine ROS  Renal/GU negative Renal ROS  negative genitourinary   Musculoskeletal negative musculoskeletal ROS (+)   Abdominal (+) + obese,   Peds negative pediatric ROS (+)  Hematology negative hematology ROS (+)   Anesthesia Other Findings   Reproductive/Obstetrics (+) Pregnancy                             Anesthesia Physical  Anesthesia Plan  ASA: II  Anesthesia Plan: Epidural   Post-op Pain Management:    Induction:   Airway Management Planned: Natural Airway  Additional Equipment:   Intra-op Plan:   Post-operative Plan:   Informed Consent: I have reviewed the patients History and Physical, chart, labs and discussed the procedure including the risks, benefits and alternatives for the proposed anesthesia with the patient or authorized representative who has indicated his/her understanding and acceptance.   Dental advisory given  Plan Discussed with: Anesthesiologist  Anesthesia Plan Comments:         Anesthesia Quick Evaluation

## 2016-06-21 NOTE — H&P (Signed)
Jocelyn Leonard is a 41 y.o. female presenting for SOL. Remote hx HSV, on PO valtrex, no current evidence of outbreak OB History    Gravida Para Term Preterm AB Living   4 1 1   2 1    SAB TAB Ectopic Multiple Live Births           1     Past Medical History:  Diagnosis Date  . Anemia   . Complication of anesthesia    post op  urinary retention  . Wears glasses    Past Surgical History:  Procedure Laterality Date  . APPENDECTOMY  1989  . CHOLECYSTECTOMY  1997  . DILATION AND EVACUATION N/A 12/31/2013   Procedure: DILATATION AND EVACUATION;  Surgeon: Meriel Picaichard M Crimson Beer, MD;  [facility] [place];  Service: Gynecology;  Laterality: N/A;   Family History: family history is not on file. Social History:  reports that she quit smoking about 2 years ago. Her smoking use included Cigarettes. She has a 7.50 pack-year smoking history. She has quit using smokeless tobacco. She reports that she drinks alcohol. She reports that she does not use drugs.     Maternal Diabetes: No Genetic Screening: Normal Maternal Ultrasounds/Referrals: Normal Fetal Ultrasounds or other Referrals:  None Maternal Substance Abuse:  No Significant Maternal Medications:  None Significant Maternal Lab Results:  None Other Comments:  None  ROS History Dilation: 9 Effacement (%): 100 Station: -2 Exam by:: dr Marcelle Overlieholland Blood pressure 99/76, pulse (!) 107, temperature 98.4 F (36.9 C), temperature source Oral, resp. rate (!) 22, height 5\' 3"  (1.6 m), weight 193 lb (87.5 kg), SpO2 99 %. Exam Physical Exam  Constitutional: She is oriented to person, place, and time. She appears well-developed and well-nourished.  HENT:  Head: Normocephalic and atraumatic.  Neck: Normal range of motion. Neck supple.  Cardiovascular: Normal rate and regular rhythm.   Respiratory: Effort normal and breath sounds normal.  GI:  Term FH/FHR 142  Genitourinary:  Genitourinary Comments: 9/C/AROM>>clear   Musculoskeletal: Normal range of motion.  Neurological: She is alert and oriented to person, place, and time.  Skin: Skin is warm and dry.    Prenatal labs: ABO, Rh: --/--/AB POS (05/29 2025) Antibody: NEG (05/29 2025) Rubella: Immune (11/01 0000) RPR: Nonreactive (11/01 0000)  HBsAg:    HIV: Non-reactive (11/01 0000)  GBS: Negative (05/02 0000)   Assessment/Plan: Term preg, nl Panorama, GBS NEG   Dorrance Sellick M 06/21/2016, 9:33 PM

## 2016-06-21 NOTE — MAU Note (Signed)
+  contractions started around 230 and have gotten worse. Rating pain 6/10.   Denies LOF, VB at this time.  +FM  [facility].

## 2016-06-21 NOTE — Anesthesia Procedure Notes (Signed)
Epidural Patient location during procedure: OB Start time: 06/21/2016 9:10 PM End time: 06/21/2016 9:22 PM  Staffing Anesthesiologist: Heather RobertsSINGER, Roseanna Koplin Performed: anesthesiologist   Preanesthetic Checklist Completed: patient identified, site marked, pre-op evaluation, timeout performed, IV checked, risks and benefits discussed and monitors and equipment checked  Epidural Patient position: sitting Prep: DuraPrep Patient monitoring: heart rate, cardiac monitor, continuous pulse ox and blood pressure Approach: midline Location: L2-L3 Injection technique: LOR saline  Needle:  Needle type: Tuohy  Needle gauge: 17 G Needle length: 9 cm Needle insertion depth: 7 cm Catheter size: 20 Guage Catheter at skin depth: 11 cm Test dose: negative and Other  Assessment Events: blood not aspirated, injection not painful, no injection resistance and negative IV test  Additional Notes Informed consent obtained prior to proceeding including risk of failure, 1% risk of PDPH, risk of minor discomfort and bruising.  Discussed rare but serious complications including epidural abscess, permanent nerve injury, epidural hematoma.  Discussed alternatives to epidural analgesia and patient desires to proceed.  Timeout performed pre-procedure verifying patient name, procedure, and platelet count.  Patient tolerated procedure well.

## 2016-06-22 ENCOUNTER — Encounter (HOSPITAL_COMMUNITY): Payer: Self-pay | Admitting: *Deleted

## 2016-06-22 LAB — CBC
HCT: 29.2 % — ABNORMAL LOW (ref 36.0–46.0)
HEMOGLOBIN: 9.9 g/dL — AB (ref 12.0–15.0)
MCH: 31.4 pg (ref 26.0–34.0)
MCHC: 33.9 g/dL (ref 30.0–36.0)
MCV: 92.7 fL (ref 78.0–100.0)
Platelets: 124 10*3/uL — ABNORMAL LOW (ref 150–400)
RBC: 3.15 MIL/uL — AB (ref 3.87–5.11)
RDW: 15 % (ref 11.5–15.5)
WBC: 10.4 10*3/uL (ref 4.0–10.5)

## 2016-06-22 LAB — ABO/RH: ABO/RH(D): AB POS

## 2016-06-22 LAB — HEPATITIS B SURFACE ANTIGEN: HEP B S AG: NEGATIVE

## 2016-06-22 LAB — RPR: RPR Ser Ql: NONREACTIVE

## 2016-06-22 MED ORDER — METHYLERGONOVINE MALEATE 0.2 MG/ML IJ SOLN
0.2000 mg | Freq: Once | INTRAMUSCULAR | Status: AC
  Administered 2016-06-22: 0.2 mg via INTRAMUSCULAR

## 2016-06-22 MED ORDER — COCONUT OIL OIL: 1.0000 "application " | TOPICAL_OIL | Status: DC | PRN

## 2016-06-22 MED ORDER — TETANUS-DIPHTH-ACELL PERTUSSIS 5-2.5-18.5 LF-MCG/0.5 IM SUSP: 0.5000 mL | Freq: Once | INTRAMUSCULAR | Status: DC

## 2016-06-22 MED ORDER — OXYCODONE-ACETAMINOPHEN 5-325 MG PO TABS: 1.0000 | ORAL_TABLET | ORAL | Status: DC | PRN

## 2016-06-22 MED ORDER — DIPHENHYDRAMINE HCL 25 MG PO CAPS: 25.0000 mg | ORAL_CAPSULE | Freq: Four times a day (QID) | ORAL | Status: DC | PRN

## 2016-06-22 MED ORDER — BENZOCAINE-MENTHOL 20-0.5 % EX AERO
1.0000 "application " | INHALATION_SPRAY | CUTANEOUS | Status: DC | PRN
  Administered 2016-06-23: 1 via TOPICAL
  Filled 2016-06-22: qty 56

## 2016-06-22 MED ORDER — ZOLPIDEM TARTRATE 5 MG PO TABS: 5.0000 mg | ORAL_TABLET | Freq: Every evening | ORAL | Status: DC | PRN

## 2016-06-22 MED ORDER — WITCH HAZEL-GLYCERIN EX PADS: 1.0000 "application " | MEDICATED_PAD | CUTANEOUS | Status: DC | PRN

## 2016-06-22 MED ORDER — MEASLES, MUMPS & RUBELLA VAC ~~LOC~~ INJ
0.5000 mL | INJECTION | Freq: Once | SUBCUTANEOUS | Status: DC
  Filled 2016-06-22: qty 0.5

## 2016-06-22 MED ORDER — OXYCODONE-ACETAMINOPHEN 5-325 MG PO TABS: 2.0000 | ORAL_TABLET | ORAL | Status: DC | PRN

## 2016-06-22 MED ORDER — SENNOSIDES-DOCUSATE SODIUM 8.6-50 MG PO TABS
2.0000 | ORAL_TABLET | ORAL | Status: DC
  Administered 2016-06-23: 2 via ORAL
  Filled 2016-06-22: qty 2

## 2016-06-22 MED ORDER — ACETAMINOPHEN 325 MG PO TABS: 650.0000 mg | ORAL_TABLET | ORAL | Status: DC | PRN

## 2016-06-22 MED ORDER — ONDANSETRON HCL 4 MG PO TABS: 4.0000 mg | ORAL_TABLET | ORAL | Status: DC | PRN

## 2016-06-22 MED ORDER — PRENATAL MULTIVITAMIN CH
1.0000 | ORAL_TABLET | Freq: Every day | ORAL | Status: DC
  Administered 2016-06-22: 1 via ORAL
  Filled 2016-06-22: qty 1

## 2016-06-22 MED ORDER — ONDANSETRON HCL 4 MG/2ML IJ SOLN: 4.0000 mg | INTRAMUSCULAR | Status: DC | PRN

## 2016-06-22 MED ORDER — BISACODYL 10 MG RE SUPP
10.0000 mg | Freq: Every day | RECTAL | Status: DC | PRN
Start: 2016-06-22 — End: 2016-06-23

## 2016-06-22 MED ORDER — SIMETHICONE 80 MG PO CHEW: 80.0000 mg | CHEWABLE_TABLET | ORAL | Status: DC | PRN

## 2016-06-22 MED ORDER — METHYLERGONOVINE MALEATE 0.2 MG/ML IJ SOLN
INTRAMUSCULAR | Status: AC
  Filled 2016-06-22: qty 1

## 2016-06-22 MED ORDER — DIBUCAINE 1 % RE OINT: 1.0000 "application " | TOPICAL_OINTMENT | RECTAL | Status: DC | PRN

## 2016-06-22 MED ORDER — FLEET ENEMA 7-19 GM/118ML RE ENEM: 1.0000 | ENEMA | Freq: Every day | RECTAL | Status: DC | PRN

## 2016-06-22 NOTE — Progress Notes (Signed)
Post Partum Day 1 Subjective: no complaints, up ad lib, tolerating PO and has only voided small amount  Objective: Blood pressure 109/67, pulse 72, temperature 98.1 F (36.7 C), temperature source Oral, resp. rate 18, height 5\' 3"  (1.6 m), weight 193 lb (87.5 kg), SpO2 99 %, unknown if currently breastfeeding.  Physical Exam:  General: alert and cooperative Lochia: appropriate Uterine Fundus: firm U even Incision: healing well DVT Evaluation: No evidence of DVT seen on physical exam. Negative Homan's sign. No cords or calf tenderness. No significant calf/ankle edema.   Recent Labs  06/21/16 2025 06/22/16 0518  HGB 11.6* 9.9*  HCT 34.1* 29.2*    Assessment/Plan: Plan for discharge tomorrow, if unable to void, may in and out cath   LOS: 1 day   Treyvin Glidden G 06/22/2016, 7:57 AM

## 2016-06-22 NOTE — Anesthesia Postprocedure Evaluation (Signed)
Anesthesia Post Note  Patient: Clifton JamesKimberly Spagnoli  Procedure(s) Performed: * No procedures listed *  Patient location during evaluation: Mother Baby Anesthesia Type: Epidural Level of consciousness: awake, awake and alert, oriented and patient cooperative Pain management: pain level controlled Vital Signs Assessment: post-procedure vital signs reviewed and stable Respiratory status: spontaneous breathing, nonlabored ventilation and respiratory function stable Cardiovascular status: stable Postop Assessment: no headache, no backache, patient able to bend at knees and no signs of nausea or vomiting Anesthetic complications: no        Last Vitals:  Vitals:   06/22/16 0220 06/22/16 0600  BP: (!) 104/59 109/67  Pulse: 86 72  Resp: 18 18  Temp: 36.9 C 36.7 C    Last Pain:  Vitals:   06/22/16 0600  TempSrc: Oral  PainSc:    Pain Goal: Patients Stated Pain Goal: 0 (06/21/16 1740)               Kristyna Bradstreet L

## 2016-06-22 NOTE — Lactation Note (Addendum)
This note was copied from a baby's chart. Lactation Consultation Note; Infant is 4716 hours old. Mother has breastfeed infant 6 times. Mother reports that infant is feeding well. She denies having difficulty with latch. Mother reports that she breastfed her first child for 2 months. Mother denies seeing any colostrum when hand expresses. Reviewed proper technique to hand express. Mother denies having any breastfeeding questions or concerns. Advised mother to breastfeed on cue and at least 8-12 times in 24 hours. Mother encouraged to do frequent skin to skin. Lactation Brochure given to mother with information on [facility], outpatient dept.and phone numbers for breastfeeding questions or concerns. Mother receptive to all teaching.   Patient Name: Jocelyn LeonardUToday's Date: 06/22/2016 Reason for consult: Initial assessment   Maternal Data Has patient been taught Hand Expression?: Yes Does the patient have breastfeeding experience prior to this delivery?: Yes  Feeding Feeding Type: Breast Fed Length of feed: 15 min  LATCH Score/Interventions                      Lactation Tools Discussed/Used     Consult Status Consult Status: Follow-up Date: 06/22/16 Follow-up type: In-patient    Stevan BornKendrick, Dem[facility] 06/22/2016, 3:25 PM

## 2016-06-22 NOTE — Progress Notes (Signed)
MOB was referred for history of depression/anxiety. * Referral screened out by Clinical Social Worker because none of the following criteria appear to apply: ~ History of anxiety/depression during this pregnancy, or of post-partum depression. ~ Diagnosis of anxiety and/or depression within last 3 years OR * MOB's symptoms currently being treated with medication and/or therapy. Please contact the Clinical Social Worker if needs arise, or if MOB requests.  MOB's medical record notes "situational anxiety and depression" in 2013.   

## 2016-06-23 MED ORDER — ACETAMINOPHEN 325 MG PO TABS: 650.0000 mg | ORAL_TABLET | Freq: Four times a day (QID) | ORAL | 1 refills | Status: AC | PRN

## 2016-06-23 MED ORDER — IBUPROFEN 200 MG PO TABS: 600.0000 mg | ORAL_TABLET | Freq: Four times a day (QID) | ORAL | 0 refills | Status: AC | PRN

## 2016-06-23 NOTE — Discharge Instructions (Signed)
Pt verbalizes understanding of all discharge instructions.

## 2016-06-23 NOTE — Lactation Note (Signed)
This note was copied from a baby's chart. Lactation Consultation Note: Mother has been cue base feeding. She reports that infant slept for 4 hours during the night. Informed mother that if infant feeding well during the day , allow infant to cue base feed at night. Discussed cluster feeding. Mother advised to do good breast massage when milk starts coming in and ice for 15 mins every 3-4 hours to reduce swelling. [facility] advised [facility] as needed for breastfeeding questions or concerns.   Patient Name: Jocelyn Leonard YNWGN'FToday's Date: 06/23/2016 Reason for consult: Follow-up assessment   Maternal Data    Feeding Feeding Type: Breast Fed  LATCH Score/Interventions                      Lactation Tools Discussed/Used     Consult Status Consult Status: Complete    Michel BickersKendrick, Shonta Bourque [facility] 06/23/2016, 9:58 AM

## 2016-06-23 NOTE — Discharge Summary (Signed)
Obstetric Discharge Summary Reason for Admission: onset of labor Prenatal Procedures: none Intrapartum Procedures: spontaneous vaginal delivery Postpartum Procedures: none Complications-Operative and Postpartum: none Hemoglobin  Date Value Ref Range Status  06/22/2016 9.9 (L) 12.0 - 15.0 g/dL Final   HCT  Date Value Ref Range Status  06/22/2016 29.2 (L) 36.0 - 46.0 % Final    Physical Exam:  General: alert, cooperative and no distress Lochia: appropriate Uterine Fundus: firm Incision: healing well DVT Evaluation: No evidence of DVT seen on physical exam.  Discharge Diagnoses: Term Pregnancy-delivered  Discharge Information: Date: 06/23/2016 Activity: pelvic rest Diet: routine Medications: PNV and Ibuprofen Condition: stable Instructions: refer to practice specific booklet Discharge to: home   Newborn Data: Live born female  Birth Weight: 8 lb 12 oz (3970 g) APGAR: 8, 9  Home with mother.  Pheobe Sandiford II,Ethaniel Garfield E 06/23/2016, 6:44 PM

## 2016-08-02 DIAGNOSIS — Z1389 Encounter for screening for other disorder: Secondary | ICD-10-CM | POA: Diagnosis not present

## 2016-10-20 DIAGNOSIS — Z23 Encounter for immunization: Secondary | ICD-10-CM | POA: Diagnosis not present

## 2017-08-30 DIAGNOSIS — Z6829 Body mass index (BMI) 29.0-29.9, adult: Secondary | ICD-10-CM | POA: Diagnosis not present

## 2017-08-30 DIAGNOSIS — Z01419 Encounter for gynecological examination (general) (routine) without abnormal findings: Secondary | ICD-10-CM | POA: Diagnosis not present

## 2017-10-23 DIAGNOSIS — Z23 Encounter for immunization: Secondary | ICD-10-CM | POA: Diagnosis not present

## 2017-11-25 DIAGNOSIS — R509 Fever, unspecified: Secondary | ICD-10-CM | POA: Diagnosis not present

## 2017-11-25 DIAGNOSIS — B349 Viral infection, unspecified: Secondary | ICD-10-CM | POA: Diagnosis not present

## 2018-10-10 DIAGNOSIS — Z6830 Body mass index (BMI) 30.0-30.9, adult: Secondary | ICD-10-CM | POA: Diagnosis not present

## 2018-10-10 DIAGNOSIS — Z01419 Encounter for gynecological examination (general) (routine) without abnormal findings: Secondary | ICD-10-CM | POA: Diagnosis not present

## 2018-10-10 DIAGNOSIS — Z1231 Encounter for screening mammogram for malignant neoplasm of breast: Secondary | ICD-10-CM | POA: Diagnosis not present

## 2018-10-11 DIAGNOSIS — Z23 Encounter for immunization: Secondary | ICD-10-CM | POA: Diagnosis not present

## 2019-05-31 DIAGNOSIS — F411 Generalized anxiety disorder: Secondary | ICD-10-CM | POA: Diagnosis not present

## 2019-07-10 DIAGNOSIS — R5383 Other fatigue: Secondary | ICD-10-CM | POA: Diagnosis not present

## 2019-07-10 DIAGNOSIS — Z13220 Encounter for screening for lipoid disorders: Secondary | ICD-10-CM | POA: Diagnosis not present

## 2019-07-10 DIAGNOSIS — Z0000 Encounter for general adult medical examination without abnormal findings: Secondary | ICD-10-CM | POA: Diagnosis not present

## 2019-11-19 DIAGNOSIS — Z23 Encounter for immunization: Secondary | ICD-10-CM | POA: Diagnosis not present

## 2019-11-20 DIAGNOSIS — Z1231 Encounter for screening mammogram for malignant neoplasm of breast: Secondary | ICD-10-CM | POA: Diagnosis not present

## 2019-11-20 DIAGNOSIS — Z6832 Body mass index (BMI) 32.0-32.9, adult: Secondary | ICD-10-CM | POA: Diagnosis not present

## 2019-11-20 DIAGNOSIS — Z01419 Encounter for gynecological examination (general) (routine) without abnormal findings: Secondary | ICD-10-CM | POA: Diagnosis not present

## 2023-08-11 ENCOUNTER — Other Ambulatory Visit (HOSPITAL_BASED_OUTPATIENT_CLINIC_OR_DEPARTMENT_OTHER): Payer: Self-pay

## 2023-08-11 DIAGNOSIS — Z79899 Other long term (current) drug therapy: Secondary | ICD-10-CM | POA: Diagnosis not present

## 2023-08-11 DIAGNOSIS — D649 Anemia, unspecified: Secondary | ICD-10-CM | POA: Diagnosis not present

## 2023-08-11 DIAGNOSIS — Z23 Encounter for immunization: Secondary | ICD-10-CM | POA: Diagnosis not present

## 2023-08-11 DIAGNOSIS — E7800 Pure hypercholesterolemia, unspecified: Secondary | ICD-10-CM | POA: Diagnosis not present

## 2023-08-11 DIAGNOSIS — F411 Generalized anxiety disorder: Secondary | ICD-10-CM | POA: Diagnosis not present

## 2023-08-11 DIAGNOSIS — Z0000 Encounter for general adult medical examination without abnormal findings: Secondary | ICD-10-CM | POA: Diagnosis not present

## 2023-08-11 MED ORDER — FLUOXETINE HCL 40 MG PO CAPS
40.0000 mg | ORAL_CAPSULE | Freq: Every day | ORAL | 3 refills | Status: DC
  Filled 2023-08-11: qty 90, 90d supply, fill #0

## 2023-08-21 ENCOUNTER — Other Ambulatory Visit (HOSPITAL_BASED_OUTPATIENT_CLINIC_OR_DEPARTMENT_OTHER): Payer: Self-pay

## 2023-11-30 DIAGNOSIS — D2262 Melanocytic nevi of left upper limb, including shoulder: Secondary | ICD-10-CM | POA: Diagnosis not present

## 2023-11-30 DIAGNOSIS — D2272 Melanocytic nevi of left lower limb, including hip: Secondary | ICD-10-CM | POA: Diagnosis not present

## 2023-11-30 DIAGNOSIS — D485 Neoplasm of uncertain behavior of skin: Secondary | ICD-10-CM | POA: Diagnosis not present

## 2023-11-30 DIAGNOSIS — D225 Melanocytic nevi of trunk: Secondary | ICD-10-CM | POA: Diagnosis not present

## 2023-11-30 DIAGNOSIS — L570 Actinic keratosis: Secondary | ICD-10-CM | POA: Diagnosis not present

## 2023-11-30 DIAGNOSIS — L814 Other melanin hyperpigmentation: Secondary | ICD-10-CM | POA: Diagnosis not present

## 2023-11-30 DIAGNOSIS — D2362 Other benign neoplasm of skin of left upper limb, including shoulder: Secondary | ICD-10-CM | POA: Diagnosis not present

## 2023-11-30 DIAGNOSIS — L578 Other skin changes due to chronic exposure to nonionizing radiation: Secondary | ICD-10-CM | POA: Diagnosis not present
